# Patient Record
Sex: Female | Born: 1955 | Race: White | Hispanic: No | Marital: Married | Smoking: Smoker, current status unknown
Health system: Southern US, Community
[De-identification: ages and names within clinical notes are randomized; demographics above are authoritative.]

## PROBLEM LIST (undated history)

## (undated) DIAGNOSIS — I10 Essential (primary) hypertension: Secondary | ICD-10-CM

## (undated) DIAGNOSIS — E43 Unspecified severe protein-calorie malnutrition: Secondary | ICD-10-CM

## (undated) DIAGNOSIS — R911 Solitary pulmonary nodule: Secondary | ICD-10-CM

## (undated) DIAGNOSIS — K219 Gastro-esophageal reflux disease without esophagitis: Secondary | ICD-10-CM

## (undated) DIAGNOSIS — E785 Hyperlipidemia, unspecified: Secondary | ICD-10-CM

## (undated) DIAGNOSIS — R5381 Other malaise: Secondary | ICD-10-CM

## (undated) DIAGNOSIS — Z930 Tracheostomy status: Secondary | ICD-10-CM

## (undated) DIAGNOSIS — F32A Depression, unspecified: Secondary | ICD-10-CM

## (undated) DIAGNOSIS — I639 Cerebral infarction, unspecified: Secondary | ICD-10-CM

## (undated) DIAGNOSIS — F329 Major depressive disorder, single episode, unspecified: Secondary | ICD-10-CM

---

## 2016-01-04 DIAGNOSIS — R911 Solitary pulmonary nodule: Secondary | ICD-10-CM

## 2016-01-04 DIAGNOSIS — Z930 Tracheostomy status: Secondary | ICD-10-CM

## 2016-01-04 DIAGNOSIS — I639 Cerebral infarction, unspecified: Secondary | ICD-10-CM

## 2016-01-04 HISTORY — DX: Solitary pulmonary nodule: R91.1

## 2016-01-04 HISTORY — DX: Tracheostomy status: Z93.0

## 2016-01-04 HISTORY — DX: Cerebral infarction, unspecified: I63.9

## 2016-02-19 ENCOUNTER — Inpatient Hospital Stay
Admission: RE | Admit: 2016-02-19 | Discharge: 2016-03-07 | Disposition: A | Discharge status: Skilled Nursing Facility | Payer: Self-pay | Attending: Internal Medicine | Admitting: Internal Medicine

## 2016-02-19 ENCOUNTER — Other Ambulatory Visit (HOSPITAL_COMMUNITY): Payer: Self-pay

## 2016-02-19 DIAGNOSIS — J9690 Respiratory failure, unspecified, unspecified whether with hypoxia or hypercapnia: Secondary | ICD-10-CM

## 2016-02-19 DIAGNOSIS — Z931 Gastrostomy status: Secondary | ICD-10-CM

## 2016-02-19 MED ORDER — IOPAMIDOL (ISOVUE-300) INJECTION 61%
50.0000 mL | Freq: Once | INTRAVENOUS | Status: AC | PRN
  Administered 2016-02-19: 50 mL

## 2016-02-20 LAB — COMPREHENSIVE METABOLIC PANEL
ALBUMIN: 2.8 g/dL — AB (ref 3.5–5.0)
ALK PHOS: 65 U/L (ref 38–126)
ALT: 37 U/L (ref 14–54)
ANION GAP: 8 (ref 5–15)
AST: 39 U/L (ref 15–41)
BILIRUBIN TOTAL: 0.6 mg/dL (ref 0.3–1.2)
BUN: 20 mg/dL (ref 6–20)
CALCIUM: 9.2 mg/dL (ref 8.9–10.3)
CO2: 28 mmol/L (ref 22–32)
Chloride: 102 mmol/L (ref 101–111)
Creatinine, Ser: 0.54 mg/dL (ref 0.44–1.00)
GFR calc Af Amer: >60 mL/min (ref >60)
GFR calc non Af Amer: >60 mL/min (ref >60)
GLUCOSE: 127 mg/dL — AB (ref 65–99)
Potassium: 4.5 mmol/L (ref 3.5–5.1)
Sodium: 138 mmol/L (ref 135–145)
TOTAL PROTEIN: 6 g/dL — AB (ref 6.5–8.1)

## 2016-02-20 LAB — CBC
HCT: 36.2 % (ref 36.0–46.0)
HEMOGLOBIN: 11.8 g/dL — AB (ref 12.0–15.0)
MCH: 28.9 pg (ref 26.0–34.0)
MCHC: 32.6 g/dL (ref 30.0–36.0)
MCV: 88.7 fL (ref 78.0–100.0)
PLATELETS: 204 10*3/uL (ref 150–400)
RBC: 4.08 MIL/uL (ref 3.87–5.11)
RDW: 14.3 % (ref 11.5–15.5)
WBC: 7.3 10*3/uL (ref 4.0–10.5)

## 2016-02-28 LAB — BASIC METABOLIC PANEL
ANION GAP: 10 (ref 5–15)
BUN: 44 mg/dL — ABNORMAL HIGH (ref 6–20)
CALCIUM: 9.9 mg/dL (ref 8.9–10.3)
CO2: 28 mmol/L (ref 22–32)
CREATININE: 0.73 mg/dL (ref 0.44–1.00)
Chloride: 104 mmol/L (ref 101–111)
GLUCOSE: 126 mg/dL — AB (ref 65–99)
Potassium: 4.5 mmol/L (ref 3.5–5.1)
Sodium: 142 mmol/L (ref 135–145)

## 2016-02-28 LAB — CBC
HCT: 42.5 % (ref 36.0–46.0)
HEMOGLOBIN: 13.6 g/dL (ref 12.0–15.0)
MCH: 29.1 pg (ref 26.0–34.0)
MCHC: 32 g/dL (ref 30.0–36.0)
MCV: 90.8 fL (ref 78.0–100.0)
PLATELETS: 195 10*3/uL (ref 150–400)
RBC: 4.68 MIL/uL (ref 3.87–5.11)
RDW: 14.7 % (ref 11.5–15.5)
WBC: 6.8 10*3/uL (ref 4.0–10.5)

## 2016-03-03 ENCOUNTER — Other Ambulatory Visit (HOSPITAL_COMMUNITY): Payer: Self-pay

## 2016-03-03 LAB — URINALYSIS, MICROSCOPIC (REFLEX)

## 2016-03-03 LAB — URINALYSIS, ROUTINE W REFLEX MICROSCOPIC
BILIRUBIN URINE: NEGATIVE
Glucose, UA: NEGATIVE mg/dL
Ketones, ur: NEGATIVE mg/dL
Nitrite: POSITIVE — AB
Protein, ur: 100 mg/dL — AB
SPECIFIC GRAVITY, URINE: 1.015 (ref 1.005–1.030)
pH: 8.5 — ABNORMAL HIGH (ref 5.0–8.0)

## 2016-03-04 ENCOUNTER — Other Ambulatory Visit (HOSPITAL_COMMUNITY): Payer: Self-pay

## 2016-03-07 LAB — URINE CULTURE: Culture: >=100000 — AB

## 2016-03-23 ENCOUNTER — Encounter (HOSPITAL_COMMUNITY): Payer: Self-pay

## 2016-03-23 ENCOUNTER — Ambulatory Visit (HOSPITAL_COMMUNITY)
Admission: RE | Admit: 2016-03-23 | Discharge: 2016-03-23 | Disposition: A | Discharge status: Home / Self Care | Payer: 59 | Source: Ambulatory Visit | Attending: Internal Medicine | Admitting: Internal Medicine

## 2016-03-23 DIAGNOSIS — R29898 Other symptoms and signs involving the musculoskeletal system: Secondary | ICD-10-CM

## 2016-03-23 DIAGNOSIS — R531 Weakness: Secondary | ICD-10-CM | POA: Diagnosis not present

## 2016-03-23 DIAGNOSIS — F329 Major depressive disorder, single episode, unspecified: Secondary | ICD-10-CM | POA: Diagnosis not present

## 2016-03-23 DIAGNOSIS — R1310 Dysphagia, unspecified: Secondary | ICD-10-CM | POA: Diagnosis not present

## 2016-03-23 DIAGNOSIS — F17200 Nicotine dependence, unspecified, uncomplicated: Secondary | ICD-10-CM | POA: Insufficient documentation

## 2016-03-23 DIAGNOSIS — Z993 Dependence on wheelchair: Secondary | ICD-10-CM | POA: Diagnosis not present

## 2016-03-23 DIAGNOSIS — Z930 Tracheostomy status: Secondary | ICD-10-CM | POA: Insufficient documentation

## 2016-03-23 DIAGNOSIS — I10 Essential (primary) hypertension: Secondary | ICD-10-CM | POA: Diagnosis not present

## 2016-03-23 DIAGNOSIS — R918 Other nonspecific abnormal finding of lung field: Secondary | ICD-10-CM | POA: Insufficient documentation

## 2016-03-23 DIAGNOSIS — R471 Dysarthria and anarthria: Secondary | ICD-10-CM | POA: Insufficient documentation

## 2016-03-23 DIAGNOSIS — Z8673 Personal history of transient ischemic attack (TIA), and cerebral infarction without residual deficits: Secondary | ICD-10-CM | POA: Diagnosis not present

## 2016-03-23 DIAGNOSIS — K219 Gastro-esophageal reflux disease without esophagitis: Secondary | ICD-10-CM | POA: Insufficient documentation

## 2016-03-23 HISTORY — DX: Major depressive disorder, single episode, unspecified: F32.9

## 2016-03-23 HISTORY — DX: Gastro-esophageal reflux disease without esophagitis: K21.9

## 2016-03-23 HISTORY — DX: Essential (primary) hypertension: I10

## 2016-03-23 HISTORY — DX: Unspecified severe protein-calorie malnutrition: E43

## 2016-03-23 HISTORY — DX: Cerebral infarction, unspecified: I63.9

## 2016-03-23 HISTORY — DX: Hyperlipidemia, unspecified: E78.5

## 2016-03-23 HISTORY — DX: Other malaise: R53.81

## 2016-03-23 HISTORY — DX: Solitary pulmonary nodule: R91.1

## 2016-03-23 HISTORY — DX: Depression, unspecified: F32.A

## 2016-03-23 HISTORY — DX: Tracheostomy status: Z93.0

## 2016-03-23 NOTE — Progress Notes (Signed)
Tracheostomy Procedure Note  Adriana Lester [id number] 1955-12-11  Pre Procedure Tracheostomy Information Pre procedure vitals HR112 Spo2 100 BP 91/58 Trach Brand: Shiley Size: 4.0 Style: Uncuffed Secured by: Velcro   Procedure: trach change    Post Procedure Tracheostomy Information  Trach Brand: Shiley Size: 4.0 Style: Uncuffed Secured by: Velcro   Post Procedure Evaluation:  ETCO2 positive color change from yellow to purple : Yes.   Vital signs:blood pressure 109/62, pulse100, respirations 15 and pulse oximetry 100 % Patients current condition: stable Complications: No apparent complications Trach site exam: clean, dry Wound care done: dry Patient did tolerate procedure well.   Education: none  Prescription needs: none    Additional needs: 12 week follow up

## 2016-03-23 NOTE — Consult Note (Signed)
Name: Adriana Lester MRN: [id number] DOB: 1955-02-22    ADMISSION DATE:  03/23/2016 CONSULTATION DATE:  3/21  REFERRING MD :  Sharyon MedicusHijazi   CHIEF COMPLAINT:  Trach manangement    HISTORY OF PRESENT ILLNESS:   This is a 61 year old female w/ remote h/o RLL lesion which [facility]. The lesion was benign but unfortunately suffered intra-operative CVA. Went to IR w/ successful clot extraction but unfortunately had significant neurological defects. This resulted in prolong hospital stay requiring trach and PEG placement. [facility] and was just d/c to the SNF about 2 weeks.  She reports to the trach clinic awake, alert, oriented. She is severely dysarthric. Over the last week her husband notes she is talking much more. Still getting nutrition via feeding tube, and slowly progressing w/ dysphagia precautions working directly w/ SLP. From a general strength stand-point she is up in chair. She has generalized weakness w/ left side being more weak. She has not required suctioning since being at her nursing home. 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 SOCIAL HISTORY:  reports that she has been smoking.  She has never used smokeless tobacco. She reports that she does not use drugs. [facility] Husband's name is Kirt BoysSteve Plan is to return to home mid April 2018  She is dependent on all ADLs Currently WC bound Gets nutrition via PEG REVIEW OF SYSTEMS:   Constitutional: Negative  for fever, chills, weight loss,+ fatigues easily. No and diaphoresis.  HENT: Negative for hearing loss, ear pain, nosebleeds, congestion, sore throat, neck pain, tinnitus and ear discharge.   Eyes: Negative for blurred vision, double vision, photophobia, pain, discharge and redness.  Respiratory: Negative for cough, hemoptysis, sputum production, shortness of breath, wheezing and stridor.   Cardiovascular: Negative for chest pain, palpitations, orthopnea, claudication, leg swelling and PND.  Gastrointestinal: Negative for heartburn, nausea, vomiting, abdominal pain, diarrhea, constipation, blood in stool and melena.  Genitourinary: Negative for dysuria, urgency, frequency, hematuria and flank pain.  Musculoskeletal: Negative for myalgias, back pain, joint pain and falls. Right shoulder pain  Skin: Negative for itching and rash.  Neurological: Negative for dizziness, tingling, tremors, sensory change, speech change, focal weakness, seizures, loss of consciousness, weakness and headaches.  Endo/Heme/Allergies: Negative for environmental allergies and polydipsia. Does not bruise/bleed easily.  SUBJECTIVE:  Frail white female sitting up in chair  VITAL SIGNS: BP: ()/()  Arterial Line BP: ()/()   PHYSICAL EXAMINATION: General appearance:  61 Year old  female, well nourished  conversant  But very emotional  Eyes: anicteric sclerae, moist conjunctivae; PERRL, EOMI bilaterally. Mouth:  membranes and no mucosal ulcerations; normal hard and soft palate Neck: Trachea midline; neck supple, no JVD, size 4 cuffless trach is unremarkable. Janina Mayorach was changed at bedside to same size # 4 cuffless. No issues.  Lungs/chest: CTA, with normal respiratory effort and no intercostal retractions CV: RRR, no MRGs  Abdomen: Soft, non-tender; no masses or HSM Extremities: No peripheral edema or  extremity lymphadenopathy Skin: Normal temperature, turgor and texture; no rash, ulcers or subcutaneous nodules Neuro/Psych:  Appropriate affect, alert and oriented to person, place and time, very dysarthric. Weak t/o right worse than left    ASSESSMENT / PLAN:  Tracheostomy dependent s/p prolonged critical illness Severe deconditioning  Recent CVA Dysarthria  Dysphagia   Discussion She is 61 year old female who is now s/p trach after prolonged critical illness. She is very deconditioned and still dysarthric but per her husband she had made some extraordinary progress in the last few weeks. As she is right now I feel that decannulation would be risky as her cough is weak and I am still concerned about aspiration. We can see how she looks at our next visit in 3 months and decide if decannulation is more reasonable  Plan Cont routine trach care ROV 3 months  Simonne Martinet ACNP-BC [facility] Pulmonary/Critical Care Pager # [phone] OR # [phone] if no answer  03/23/2016, 3:33 PM

## 2016-03-28 DIAGNOSIS — R29898 Other symptoms and signs involving the musculoskeletal system: Secondary | ICD-10-CM | POA: Insufficient documentation

## 2016-03-28 DIAGNOSIS — Z930 Tracheostomy status: Secondary | ICD-10-CM | POA: Insufficient documentation

## 2016-03-28 DIAGNOSIS — Z8673 Personal history of transient ischemic attack (TIA), and cerebral infarction without residual deficits: Secondary | ICD-10-CM | POA: Insufficient documentation

## 2016-06-15 ENCOUNTER — Ambulatory Visit (HOSPITAL_COMMUNITY)
Admission: RE | Admit: 2016-06-15 | Discharge: 2016-06-15 | Disposition: A | Discharge status: Home / Self Care | Payer: Managed Care, Other (non HMO) | Source: Ambulatory Visit | Attending: Acute Care | Admitting: Acute Care

## 2016-06-15 DIAGNOSIS — Z8673 Personal history of transient ischemic attack (TIA), and cerebral infarction without residual deficits: Secondary | ICD-10-CM | POA: Diagnosis not present

## 2016-06-15 DIAGNOSIS — Z930 Tracheostomy status: Secondary | ICD-10-CM | POA: Diagnosis present

## 2016-06-15 DIAGNOSIS — R471 Dysarthria and anarthria: Secondary | ICD-10-CM | POA: Insufficient documentation

## 2016-06-15 DIAGNOSIS — R1310 Dysphagia, unspecified: Secondary | ICD-10-CM | POA: Diagnosis not present

## 2016-06-15 NOTE — Progress Notes (Signed)
   Subjective:    Patient ID: Adriana Lester, female    DOB: [birth year], 61 y.o.   MRN: [id number]  HPI This is a 61 year old female whom I saw for the first time on 3/21 for trach management s/p prolonged hospital stay where she originally went for a wedge resection but her hospital course was complicated by an acute Cva. She has been at a snf. When I last saw her I felt she was not yet strong enough to be decannulated and that I wanted to give her more time w/ red capping trial before we would consider this. She reports today w/ her husband and son. She is using the red capping valve. She has not required tracheal suction for several months. She is not eating but her dysarthria has improved greatly and she is able to cough out mucous and expectorate.    Review of Systems  Constitutional: Negative for activity change, appetite change, fatigue and fever.  HENT: Negative.   Eyes: Negative.   Respiratory: Negative.   Cardiovascular: Negative.   Gastrointestinal: Negative.   Endocrine: Negative.   Musculoskeletal: Negative.   Allergic/Immunologic: Negative.   Neurological: Positive for weakness.  Psychiatric/Behavioral: Negative.       146/74, pulse 92, respirations 15 and pulse oximetry 98 % Objective:   Physical Exam  Constitutional: She is oriented to person, place, and time. She appears well-developed and well-nourished. No distress.  HENT:  Head: Normocephalic and atraumatic.  Eyes: Conjunctivae are normal. Pupils are equal, round, and reactive to light. Right eye exhibits no discharge.  Neck: Normal range of motion. Neck supple.  Janina Mayorach is unremarkable   Cardiovascular: Normal rate and regular rhythm.   Pulmonary/Chest: Effort normal and breath sounds normal.  Abdominal: Soft. Bowel sounds are normal.  Musculoskeletal: Normal range of motion. She exhibits no edema, tenderness or deformity.  Neurological: She is alert and oriented to person, place, and time.  Skin: Skin is warm. She  is not diaphoretic.  Psychiatric: She has a normal mood and affect.       Assessment & Plan:   Tracheostomy dependence s/p prolonged critical illness Severe deconditioning Recent cva Dysarthria  Dysphagia   Discussion  [name] has been doing well. She continues to improve. Wears her pmv 24/7 without issue. Not requiring suctioning.  -I decannulated her   Plan Keep occlusive dressing 24-48hrs Cont current aspiration precautions rov 2 weeks if stoma does not close at which point she would need ent eval    My time 32 minutes   Simonne MartinetPeter E Shantel Wesely ACNP-BC [facility] Pulmonary/Critical Care Pager # [phone] OR # [phone] if no answer

## 2016-06-15 NOTE — Progress Notes (Signed)
Tracheostomy Procedure Note  Adriana Lester [id number] [birth year]  Pre Procedure Tracheostomy Information HR 95 RR 15 SpO2 96 BP 131/105 Trach Brand: Shiley Size: 4.0 Style: Uncuffed Secured by: Velcro   Procedure: decannulation    Post Procedure Tracheostomy Information    Post Procedure Evaluation:  Vital signs:blood pressure 146/74, pulse 92, respirations 15 and pulse oximetry 98 % Patients current condition: stable Complications: No apparent complications Trach site exam: clean, dry Wound care done: dry and bandaid-type dressing Patient did tolerate procedure well.   Education: Pt and family given direction on stoma care  Prescription needs: none    Additional needs: none

## 2016-06-16 DIAGNOSIS — Z930 Tracheostomy status: Secondary | ICD-10-CM | POA: Insufficient documentation

## 2018-02-03 DEATH — deceased

## 2018-10-09 IMAGING — CR DG CHEST 1V PORT
1 series · 1 of 1 positions shown · non-contrast
Comparison: Single-view of the chest 02/21/2016.

CLINICAL DATA: Respiratory failure.

EXAM:
PORTABLE CHEST 1 VIEW

[portable]
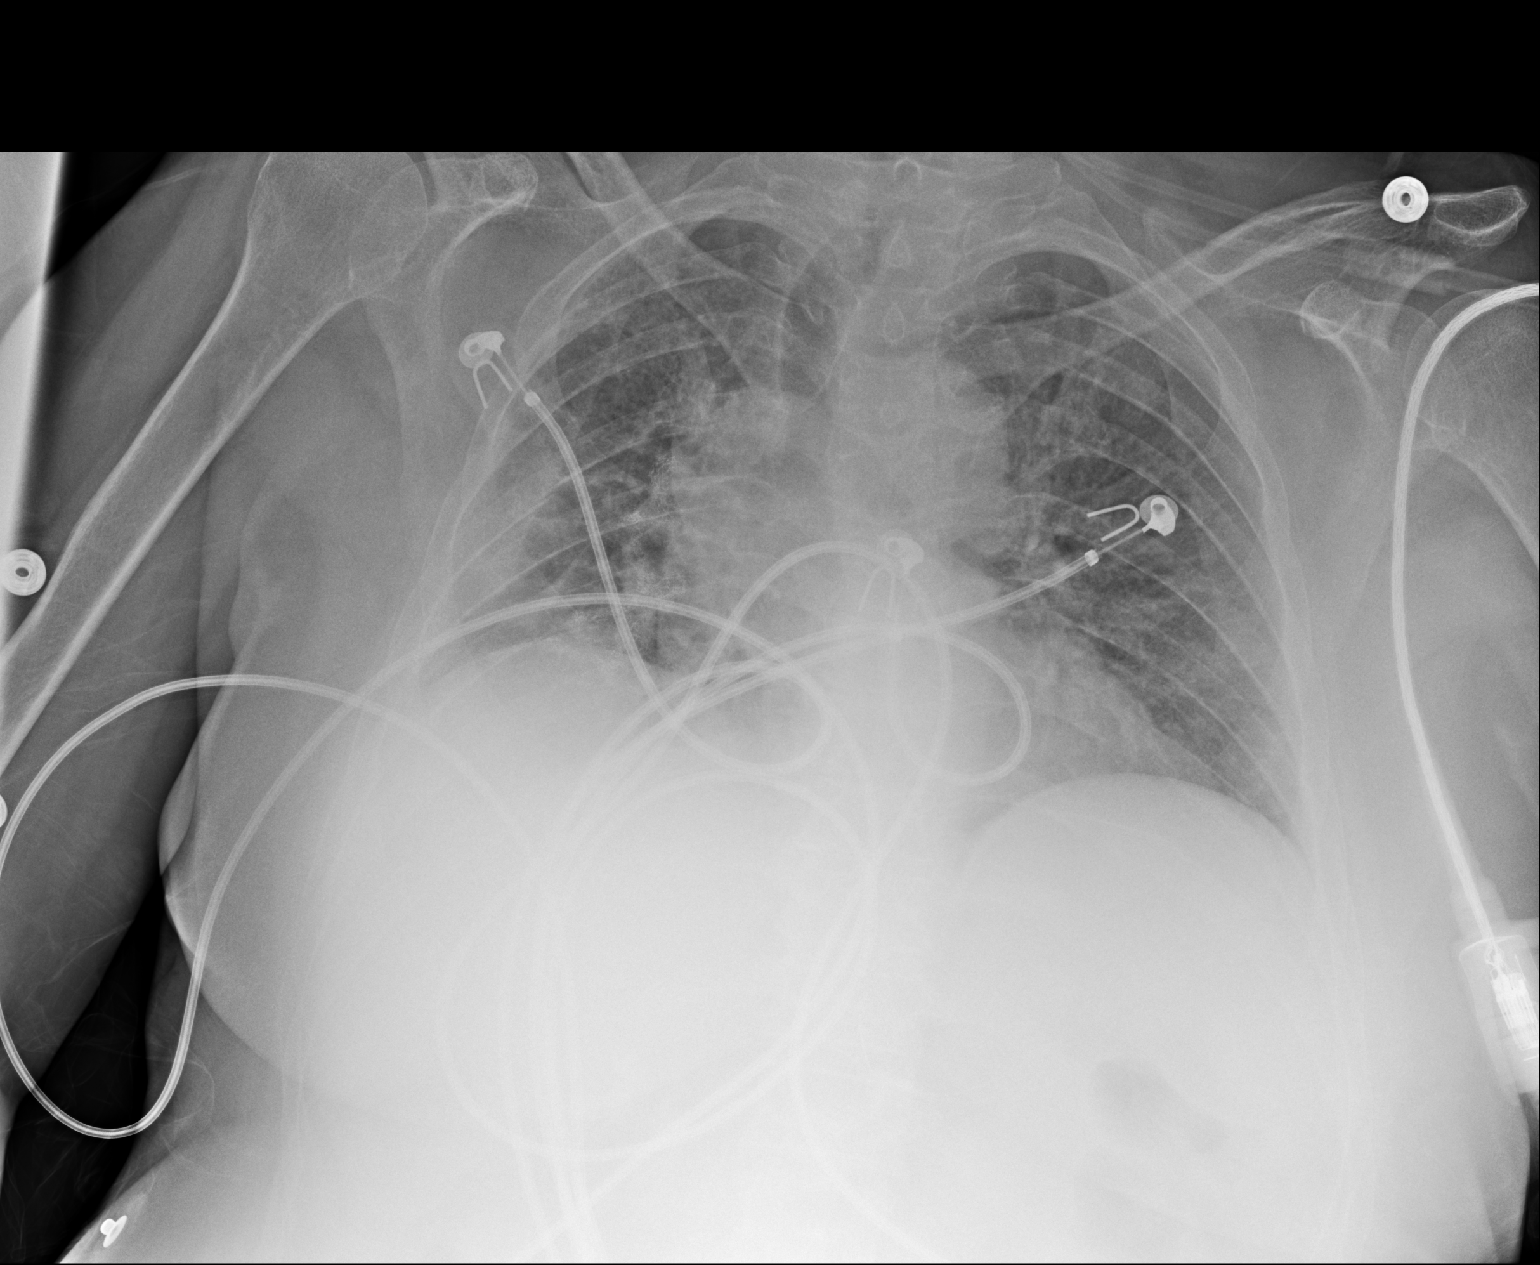

[1 of 1 positions shown; findings below may reference images not displayed]

FINDINGS: Single-view of the chest seen on the prior examination has been
removed. Lung volumes are low with mild basilar atelectasis. Patchy
airspace disease in the right mid and lower lung zones has improved.
The left lung is clear. Heart size is upper normal. No pneumothorax
or pleural effusion.
IMPRESSION: Improved right side airspace disease since the most recent
examination.

Mild bibasilar atelectasis in a low volume chest.
